# Patient Record
Sex: Female | Born: 1995 | Hispanic: Yes | Marital: Single | Smoking: Never smoker
Health system: Southern US, Community
[De-identification: ages and names within clinical notes are randomized; demographics above are authoritative.]

## PROBLEM LIST (undated history)

## (undated) DIAGNOSIS — N390 Urinary tract infection, site not specified: Secondary | ICD-10-CM

## (undated) DIAGNOSIS — U071 COVID-19: Secondary | ICD-10-CM

## (undated) DIAGNOSIS — K8020 Calculus of gallbladder without cholecystitis without obstruction: Secondary | ICD-10-CM

---

## 2019-12-11 ENCOUNTER — Emergency Department (HOSPITAL_COMMUNITY): Payer: Self-pay

## 2019-12-11 ENCOUNTER — Encounter (HOSPITAL_COMMUNITY): Payer: Self-pay

## 2019-12-11 ENCOUNTER — Emergency Department (HOSPITAL_COMMUNITY)
Admission: EM | Admit: 2019-12-11 | Discharge: 2019-12-11 | Disposition: A | Discharge status: Home / Self Care | Payer: Self-pay | Attending: Emergency Medicine | Admitting: Emergency Medicine

## 2019-12-11 ENCOUNTER — Other Ambulatory Visit: Payer: Self-pay

## 2019-12-11 DIAGNOSIS — R197 Diarrhea, unspecified: Secondary | ICD-10-CM | POA: Insufficient documentation

## 2019-12-11 DIAGNOSIS — R519 Headache, unspecified: Secondary | ICD-10-CM | POA: Insufficient documentation

## 2019-12-11 DIAGNOSIS — Z20822 Contact with and (suspected) exposure to covid-19: Secondary | ICD-10-CM | POA: Insufficient documentation

## 2019-12-11 DIAGNOSIS — N10 Acute tubulo-interstitial nephritis: Secondary | ICD-10-CM | POA: Insufficient documentation

## 2019-12-11 DIAGNOSIS — R000 Tachycardia, unspecified: Secondary | ICD-10-CM | POA: Insufficient documentation

## 2019-12-11 DIAGNOSIS — Z8616 Personal history of COVID-19: Secondary | ICD-10-CM | POA: Insufficient documentation

## 2019-12-11 DIAGNOSIS — N12 Tubulo-interstitial nephritis, not specified as acute or chronic: Secondary | ICD-10-CM

## 2019-12-11 HISTORY — DX: COVID-19: U07.1

## 2019-12-11 HISTORY — DX: Calculus of gallbladder without cholecystitis without obstruction: K80.20

## 2019-12-11 HISTORY — DX: Urinary tract infection, site not specified: N39.0

## 2019-12-11 LAB — URINALYSIS, ROUTINE W REFLEX MICROSCOPIC
Bilirubin Urine: NEGATIVE
Glucose, UA: NEGATIVE mg/dL
Hgb urine dipstick: NEGATIVE
Ketones, ur: 5 mg/dL — AB
Nitrite: NEGATIVE
Protein, ur: 100 mg/dL — AB
Specific Gravity, Urine: 1.019 (ref 1.005–1.030)
pH: 7 (ref 5.0–8.0)

## 2019-12-11 LAB — COMPREHENSIVE METABOLIC PANEL
ALT: 37 U/L (ref 0–44)
AST: 32 U/L (ref 15–41)
Albumin: 4.1 g/dL (ref 3.5–5.0)
Alkaline Phosphatase: 117 U/L (ref 38–126)
Anion gap: 12 (ref 5–15)
BUN: 9 mg/dL (ref 6–20)
CO2: 22 mmol/L (ref 22–32)
Calcium: 8.9 mg/dL (ref 8.9–10.3)
Chloride: 101 mmol/L (ref 98–111)
Creatinine, Ser: 0.87 mg/dL (ref 0.44–1.00)
GFR, Estimated: >60 mL/min (ref >60)
Glucose, Bld: 89 mg/dL (ref 70–99)
Potassium: 3.9 mmol/L (ref 3.5–5.1)
Sodium: 135 mmol/L (ref 135–145)
Total Bilirubin: 1.7 mg/dL — ABNORMAL HIGH (ref 0.3–1.2)
Total Protein: 8 g/dL (ref 6.5–8.1)

## 2019-12-11 LAB — CBC WITH DIFFERENTIAL/PLATELET
Abs Immature Granulocytes: 0.03 10*3/uL (ref 0.00–0.07)
Basophils Absolute: 0 10*3/uL (ref 0.0–0.1)
Basophils Relative: 0 %
Eosinophils Absolute: 0 10*3/uL (ref 0.0–0.5)
Eosinophils Relative: 0 %
HCT: 44 % (ref 36.0–46.0)
Hemoglobin: 14 g/dL (ref 12.0–15.0)
Immature Granulocytes: 0 %
Lymphocytes Relative: 15 %
Lymphs Abs: 1.8 10*3/uL (ref 0.7–4.0)
MCH: 26.8 pg (ref 26.0–34.0)
MCHC: 31.8 g/dL (ref 30.0–36.0)
MCV: 84.1 fL (ref 80.0–100.0)
Monocytes Absolute: 1.6 10*3/uL — ABNORMAL HIGH (ref 0.1–1.0)
Monocytes Relative: 12 %
Neutro Abs: 9.1 10*3/uL — ABNORMAL HIGH (ref 1.7–7.7)
Neutrophils Relative %: 73 %
Platelets: 288 10*3/uL (ref 150–400)
RBC: 5.23 MIL/uL — ABNORMAL HIGH (ref 3.87–5.11)
RDW: 13.5 % (ref 11.5–15.5)
WBC: 12.6 10*3/uL — ABNORMAL HIGH (ref 4.0–10.5)
nRBC: 0 % (ref 0.0–0.2)

## 2019-12-11 LAB — I-STAT BETA HCG BLOOD, ED (MC, WL, AP ONLY): I-stat hCG, quantitative: <5 m[IU]/mL (ref <5)

## 2019-12-11 LAB — LIPASE, BLOOD: Lipase: 25 U/L (ref 11–51)

## 2019-12-11 LAB — RESP PANEL BY RT-PCR (FLU A&B, COVID) ARPGX2
Influenza A by PCR: NEGATIVE
Influenza B by PCR: NEGATIVE
SARS Coronavirus 2 by RT PCR: NEGATIVE

## 2019-12-11 MED ORDER — SODIUM CHLORIDE 0.9 % IV SOLN
1.0000 g | Freq: Once | INTRAVENOUS | Status: AC
  Administered 2019-12-11: 1 g via INTRAVENOUS
  Filled 2019-12-11: qty 10

## 2019-12-11 MED ORDER — ONDANSETRON HCL 4 MG PO TABS: 4.0000 mg | ORAL_TABLET | Freq: Three times a day (TID) | ORAL | 0 refills | Status: AC | PRN

## 2019-12-11 MED ORDER — ONDANSETRON HCL 4 MG/2ML IJ SOLN
4.0000 mg | Freq: Once | INTRAMUSCULAR | Status: AC
  Administered 2019-12-11: 4 mg via INTRAVENOUS
  Filled 2019-12-11: qty 2

## 2019-12-11 MED ORDER — SODIUM CHLORIDE 0.9 % IV BOLUS
1000.0000 mL | Freq: Once | INTRAVENOUS | Status: AC
  Administered 2019-12-11: 1000 mL via INTRAVENOUS

## 2019-12-11 MED ORDER — SODIUM CHLORIDE (PF) 0.9 % IJ SOLN
INTRAMUSCULAR | Status: AC
  Filled 2019-12-11: qty 50

## 2019-12-11 MED ORDER — MORPHINE SULFATE (PF) 4 MG/ML IV SOLN
4.0000 mg | Freq: Once | INTRAVENOUS | Status: AC
  Administered 2019-12-11: 4 mg via INTRAVENOUS
  Filled 2019-12-11: qty 1

## 2019-12-11 MED ORDER — IOHEXOL 300 MG/ML  SOLN
100.0000 mL | Freq: Once | INTRAMUSCULAR | Status: AC | PRN
  Administered 2019-12-11: 100 mL via INTRAVENOUS

## 2019-12-11 MED ORDER — CEPHALEXIN 500 MG PO CAPS: ORAL_CAPSULE | ORAL | 0 refills | Status: AC

## 2019-12-11 MED ORDER — HYDROCODONE-ACETAMINOPHEN 5-325 MG PO TABS
1.0000 | ORAL_TABLET | Freq: Four times a day (QID) | ORAL | 0 refills | Status: AC | PRN
Start: 2019-12-11 — End: ?

## 2019-12-11 NOTE — ED Provider Notes (Signed)
[facility]-EMERGENCY DEPT Provider Note   CSN: [id number] Arrival date & time: 12/11/19  1012     History Chief Complaint  Patient presents with  . Abdominal Pain    Deanna Stanley is a 24 y.o. female.  The history is provided by the patient and medical records. The history is limited by a language barrier. A language interpreter was used.  Abdominal Pain    23 year old Hispanic female with history of cholelithiasis presenting for evaluation of abdominal pain.  History obtained through language interpreter.  Patient report a month ago she was diagnosed with having gallstones.  For the past 3 days she has been having abdominal pain.  She described pain as a sharp sensation to her right upper quadrant radiates to her back with associated nausea, vomiting bilious content and having some loose stools.  Pain is moderate to severe, she is afraid to eat.  She also endorsed having fever, chills, headache and dysuria.  She tries taking over-the-counter medication at home without relief.  She report eating worsen his symptoms.  She has not been vaccinated for Covid.  She would like to get her gallbladder removed.  She also mention been hospitalized for kidney infection previously.  No runny nose sneezing coughing chest pain or shortness of breath.  Past Medical History:  Diagnosis Date  . COVID-19   . Gallstone   . UTI (urinary tract infection)     There are no problems to display for this patient.   Past Surgical History:  Procedure Laterality Date  . CESAREAN SECTION       OB History   No obstetric history on file.     Family History  Problem Relation Age of Onset  . Healthy Mother   . Healthy Father     Social History   Tobacco Use  . Smoking status: Never Smoker  . Smokeless tobacco: Never Used  Vaping Use  . Vaping Use: Never used  Substance Use Topics  . Alcohol use: Never  . Drug use: Never    Home Medications Prior to Admission  medications   Not on File    Allergies    Other  Review of Systems   Review of Systems  Gastrointestinal: Positive for abdominal pain.  All other systems reviewed and are negative.   Physical Exam Updated Vital Signs BP 124/74 (BP Location: Left Arm)   Pulse (!) 109   Temp 100.1 F (37.8 C) (Oral)   Resp 20   Ht 5' 3.78" (1.62 m)   Wt 78 kg   LMP 12/04/2019   SpO2 100%   BMI 29.73 kg/m   Physical Exam Vitals and nursing note reviewed.  Constitutional:      Appearance: She is well-developed. She is obese.     Comments: Appears uncomfortable.  HENT:     Head: Atraumatic.  Eyes:     Conjunctiva/sclera: Conjunctivae normal.  Cardiovascular:     Rate and Rhythm: Tachycardia present.     Heart sounds: Normal heart sounds.  Pulmonary:     Effort: Pulmonary effort is normal.     Breath sounds: Normal breath sounds. No wheezing, rhonchi or rales.  Abdominal:     General: Abdomen is flat.     Palpations: Abdomen is soft.     Tenderness: There is generalized abdominal tenderness and tenderness in the right upper quadrant. There is right CVA tenderness and left CVA tenderness. Positive signs include Murphy's sign. Negative signs include Rovsing's sign and McBurney's sign.  Hernia: No hernia is present.  Musculoskeletal:     Cervical back: Neck supple.  Skin:    Findings: No rash.  Neurological:     Mental Status: She is alert.     ED Results / Procedures / Treatments   Labs (all labs ordered are listed, but only abnormal results are displayed) Labs Reviewed  CBC WITH DIFFERENTIAL/PLATELET - Abnormal; Notable for the following components:      Result Value   WBC 12.6 (*)    RBC 5.23 (*)    Neutro Abs 9.1 (*)    Monocytes Absolute 1.6 (*)    All other components within normal limits  COMPREHENSIVE METABOLIC PANEL - Abnormal; Notable for the following components:   Total Bilirubin 1.7 (*)    All other components within normal limits  URINALYSIS, ROUTINE W  REFLEX MICROSCOPIC - Abnormal; Notable for the following components:   APPearance HAZY (*)    Ketones, ur 5 (*)    Protein, ur 100 (*)    Leukocytes,Ua TRACE (*)    Bacteria, UA RARE (*)    All other components within normal limits  RESP PANEL BY RT-PCR (FLU A&B, COVID) ARPGX2  URINE CULTURE  LIPASE, BLOOD  I-STAT BETA HCG BLOOD, ED ([facility], [facility], [facility] ONLY)    EKG None  Radiology CT ABDOMEN PELVIS W CONTRAST  Result Date: 12/11/2019 CLINICAL DATA:  Abdominal pain EXAM: CT ABDOMEN AND PELVIS WITH CONTRAST TECHNIQUE: Multidetector CT imaging of the abdomen and pelvis was performed using the standard protocol following bolus administration of intravenous contrast. CONTRAST:  OMNIPAQUE IOHEXOL 300 MG/ML  SOLN COMPARISON:  None. FINDINGS: Lower chest: No acute abnormality. Hepatobiliary: No focal liver abnormality is seen. No gallstones, gallbladder wall thickening, or biliary dilatation. Pancreas: Unremarkable. No pancreatic ductal dilatation or surrounding inflammatory changes. Spleen: Normal in size without focal abnormality. Adrenals/Urinary Tract: Adrenals are unremarkable. Right renal upper pole cortical scarring. There is an area of hypoenhancement of the right renal parenchyma at the lower pole. Adjacent perinephric fat stranding. Possible mild right renal collecting system urothelial enhancement extending along the proximal ureter. There is no stone. Bladder is partially distended but otherwise unremarkable. Stomach/Bowel: Stomach is within normal limits. Bowel is normal in caliber. Normal appendix. Vascular/Lymphatic: No significant vascular findings are present. There are no enlarged lymph nodes identified. Reproductive: Uterus and bilateral adnexa are unremarkable. Other: No ascites.  Abdominal wall is unremarkable. Musculoskeletal: No acute or significant osseous abnormality. IMPRESSION: Suspected ascending urinary tract infection with right pyelonephritis. No hydronephrosis or stone.  Electronically Signed   By: Guadlupe Spanish M.D.   On: 12/11/2019 13:30   US Abdomen Limited  Result Date: 12/11/2019 CLINICAL DATA:  Right upper quadrant pain EXAM: ULTRASOUND ABDOMEN LIMITED RIGHT UPPER QUADRANT COMPARISON:  None. FINDINGS: Gallbladder: No gallstones or wall thickening visualized. No sonographic Murphy sign noted by sonographer. Common bile duct: Diameter: 4 mm Liver: Increased parenchymal echogenicity. Normal size. No mass or focal lesion. Portal vein is patent on color Doppler imaging with normal direction of blood flow towards the liver. Other: None. IMPRESSION: 1. No acute findings.  Normal gallbladder.  No bile duct dilation. 2. Increased liver parenchymal echogenicity consistent with hepatic steatosis. Electronically Signed   By: Amie Portland M.D.   On: 12/11/2019 12:05    Procedures Procedures (including critical care time)  Medications Ordered in ED Medications  sodium chloride (PF) 0.9 % injection (has no administration in time range)  sodium chloride 0.9 % bolus 1,000 mL (1,000 mLs Intravenous  New Bag/Given 12/11/19 1115)  morphine 4 MG/ML injection 4 mg (4 mg Intravenous Given 12/11/19 1116)  ondansetron (ZOFRAN) injection 4 mg (4 mg Intravenous Given 12/11/19 1116)  iohexol (OMNIPAQUE) 300 MG/ML solution 100 mL (100 mLs Intravenous Contrast Given 12/11/19 1308)  cefTRIAXone (ROCEPHIN) 1 g in sodium chloride 0.9 % 100 mL IVPB (1 g Intravenous New Bag/Given 12/11/19 1355)    ED Course  I have reviewed the triage vital signs and the nursing notes.  Pertinent labs & imaging results that were available during my care of the patient were reviewed by me and considered in my medical decision making (see chart for details).    MDM Rules/Calculators/A&P                          BP 110/68 (BP Location: Right Arm)   Pulse 94   Temp 99.5 F (37.5 C) (Oral)   Resp 18   Ht 5' 3.78" (1.62 m)   Wt 78 kg   LMP 12/04/2019   SpO2 100%   BMI 29.73 kg/m   Final  Clinical Impression(s) / ED Diagnoses Final diagnoses:  Pyelonephritis    Rx / DC Orders ED Discharge Orders         Ordered    cephALEXin (KEFLEX) 500 MG capsule        12/11/19 1509    ondansetron (ZOFRAN) 4 MG tablet  Every 8 hours PRN        12/11/19 1509         10:53 AM Patient with history of cholelithiasis here with progressive worsening right upper quadrant abdominal pain with associated bilious vomiting concerning for cholecystitis.  She also endorsed some urinary discomfort as well.  She does have an oral temperature of 100.1, tachycardic with a heart rate of 109.  Work-up initiated.  Limited abdominal ultrasound show no evidence of gallstones no evidence of cholecystitis.  Urine did show 21-50 WBC, 11-20 RBCs and rare bacteria.  Elevated white count of 12.6.  Abdominal pelvis CT scan obtained showing signs of ascending UTI concerning for pyelonephritis more significant in the right side which is consistent with patient's presentation.  Urine culture ordered, will give Rocephin IV antibiotic.  If patient tolerates p.o., she can be discharged home with Keflex.   Fayrene Helper, PA-C 12/11/19 1510    Terrilee Files, MD 12/12/19 [phone]

## 2019-12-11 NOTE — Discharge Instructions (Signed)
You have been evaluated for your symptoms.  Your pain is likely due to a kidney infection.  Please take antibiotic as prescribed for the full duration.  Your gallbladder is normal without any evidence of gallstone.  A urine culture has been obtained and sent.  In the next several days if we found a different antibiotic that can work better for your infection we will contact you with recommendation.  Return if you have any concern.

## 2019-12-11 NOTE — ED Triage Notes (Signed)
Patient reports that she has a known gallstone and is having RUQ pain that radiates into the back and N/V x 3 days.

## 2019-12-13 LAB — URINE CULTURE: Culture: >=100000 — AB

## 2021-08-24 IMAGING — US US ABDOMEN LIMITED
1 series · 14 of 25 positions shown · non-contrast
Comparison: None.

CLINICAL DATA: Right upper quadrant pain

EXAM:
ULTRASOUND ABDOMEN LIMITED RIGHT UPPER QUADRANT

[Series 1: us abdomen limited · 14 of 86 slices shown]
[im 1/86]
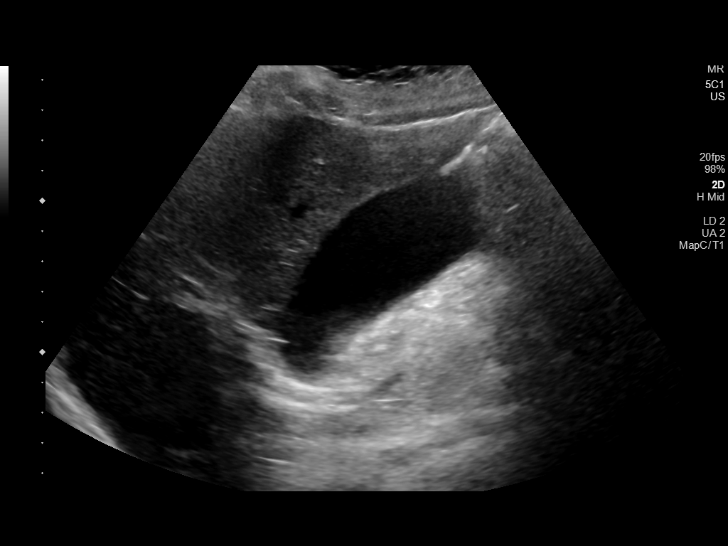
[im 8/86]
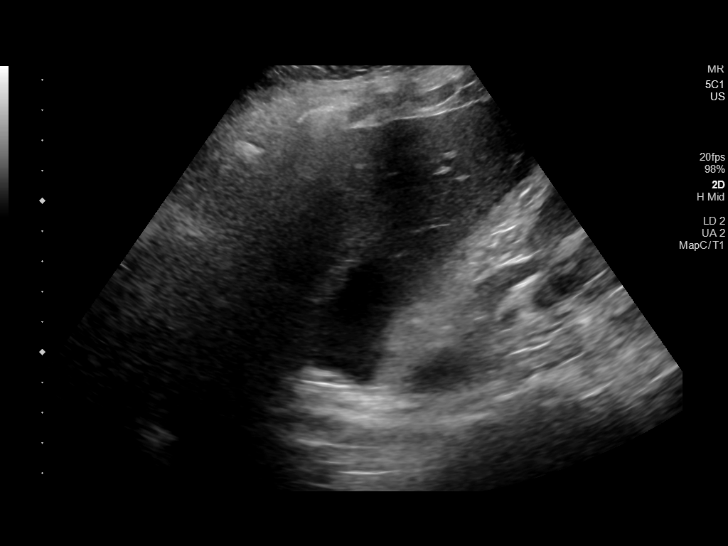
[im 15/86]
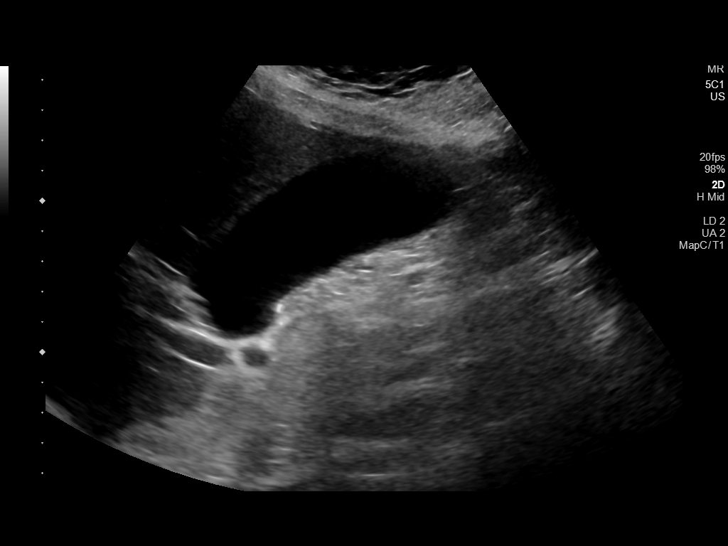
[im 22/86]
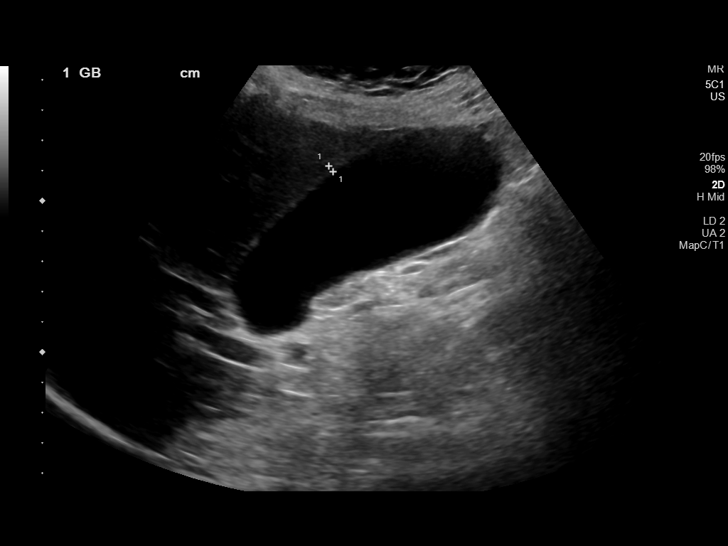
[im 29/86]
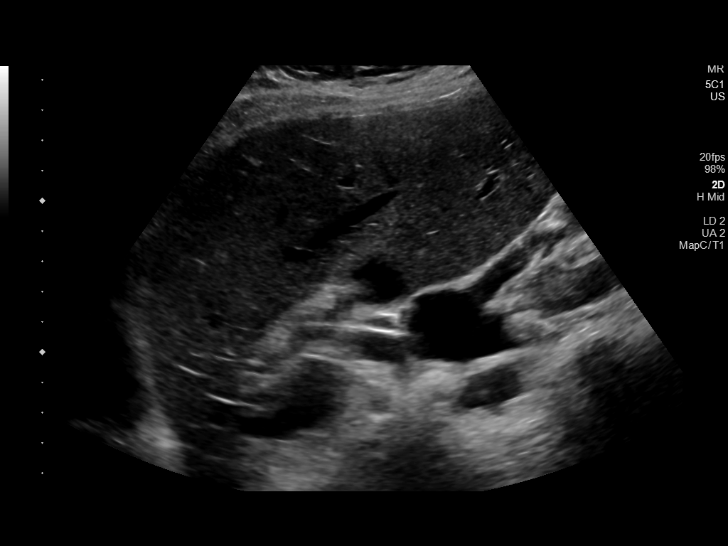
[im 32/86]
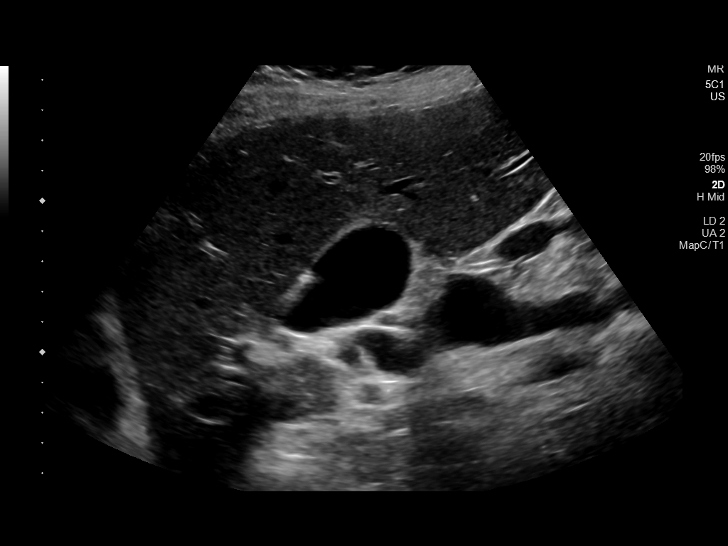
[im 39/86]
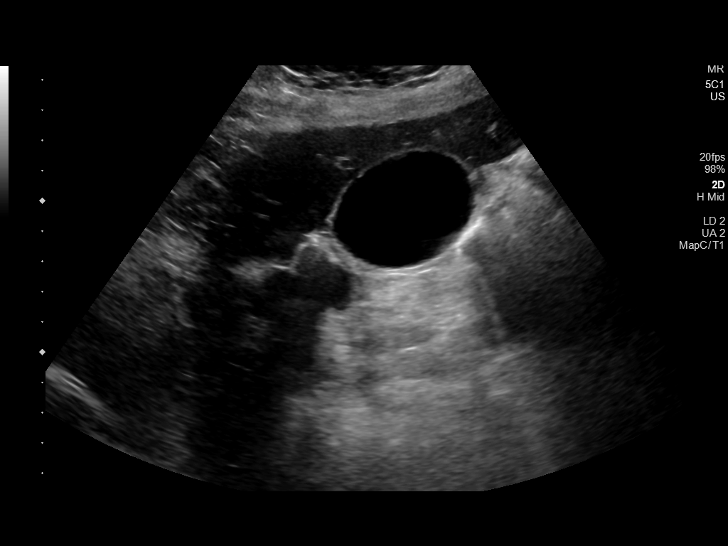
[im 47/86]
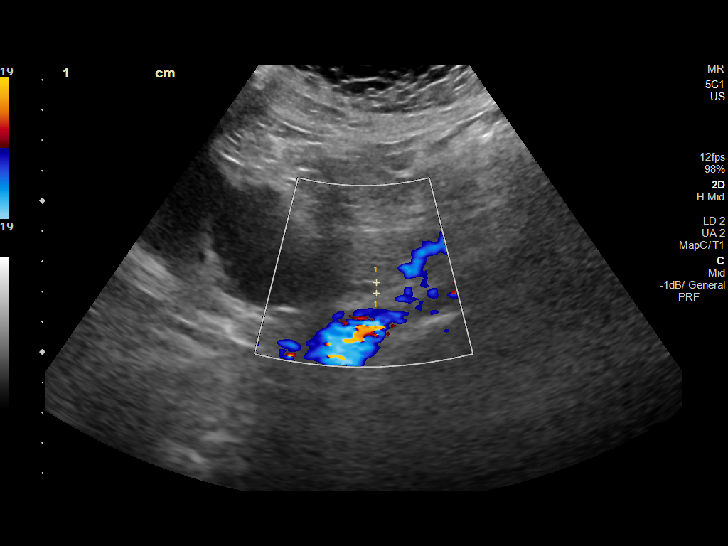
[im 54/86]
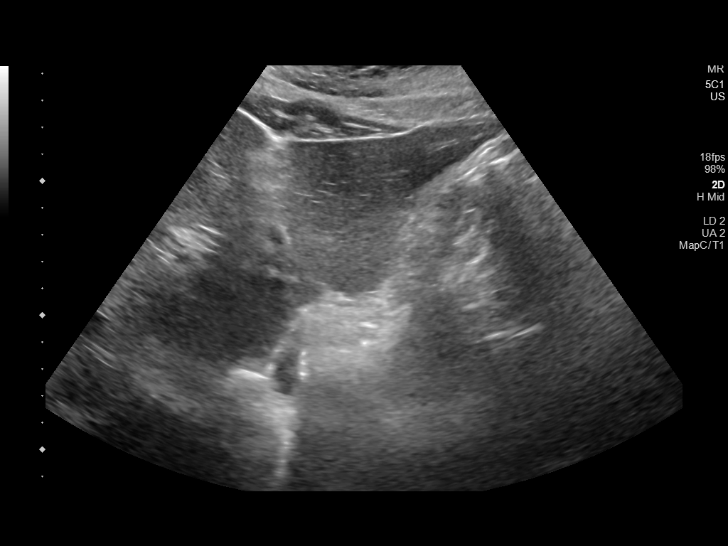
[im 57/86]
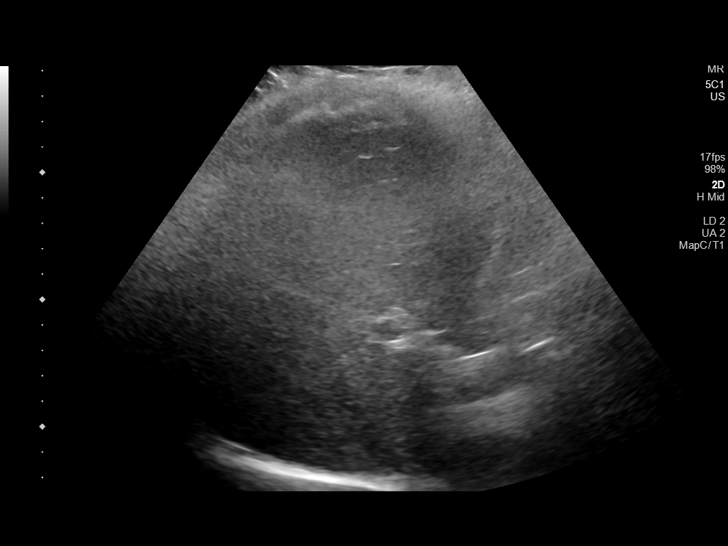
[im 64/86]
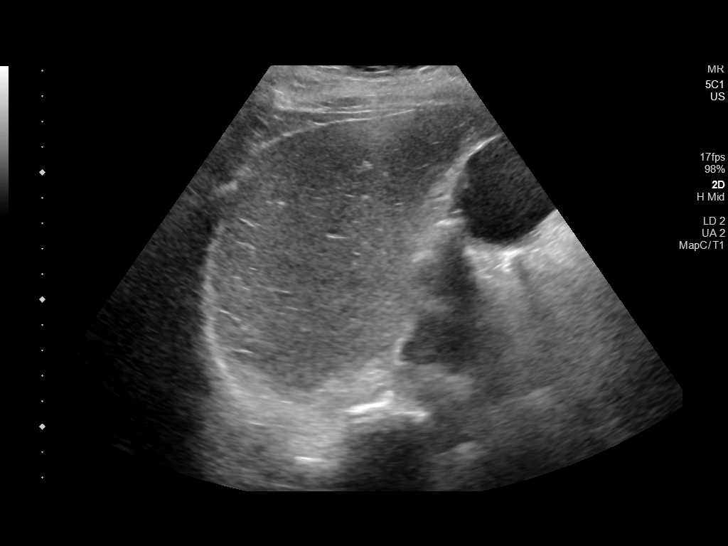
[im 71/86]
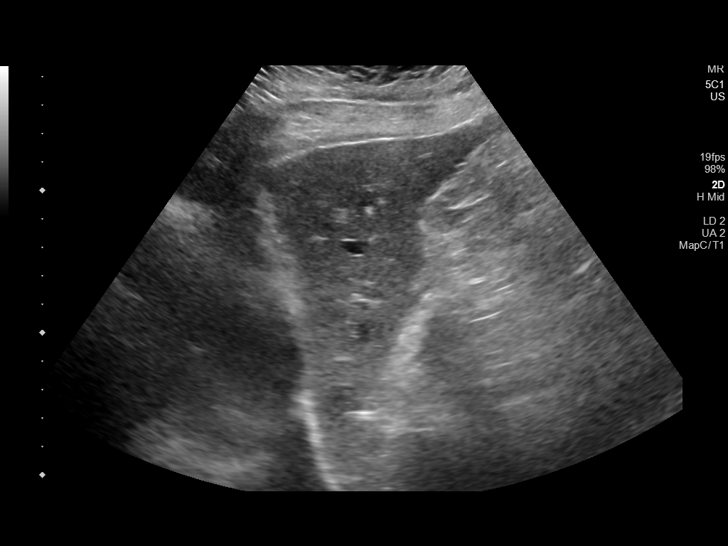
[im 78/86]
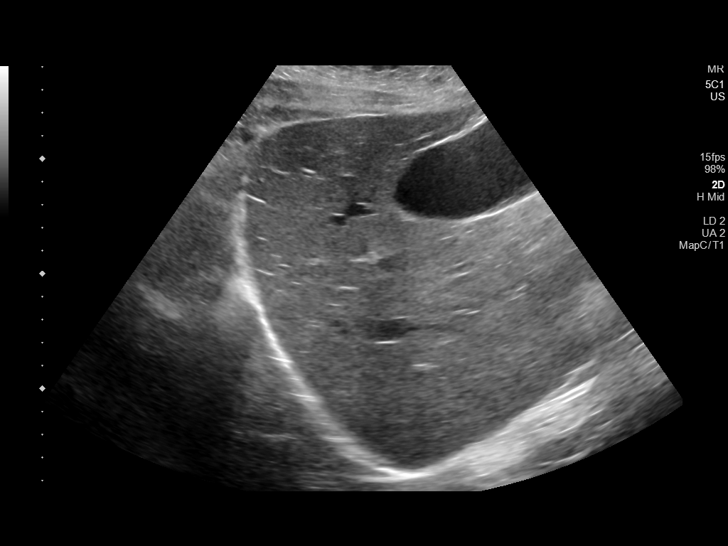
[im 86/86]
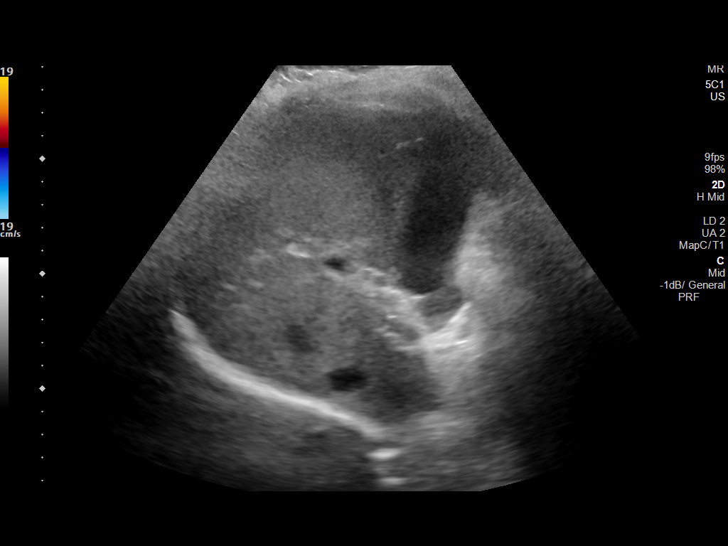

[14 of 25 positions shown; findings below may reference images not displayed]

FINDINGS: Gallbladder:

No gallstones or wall thickening visualized. No sonographic Murphy
sign noted by sonographer.

Common bile duct:

Diameter: 4 mm

Liver:

Increased parenchymal echogenicity. Normal size. No mass or focal
lesion. Portal vein is patent on color Doppler imaging with normal
direction of blood flow towards the liver.

Other: None.
IMPRESSION: 1. No acute findings.  Normal gallbladder.  No bile duct dilation.
2. Increased liver parenchymal echogenicity consistent with hepatic
steatosis.
